# Patient Record
Sex: Female | Born: 2000 | Race: White | Hispanic: No | Marital: Single | Smoking: Never smoker
Health system: Southern US, Community
[De-identification: ages and names within clinical notes are randomized; demographics above are authoritative.]

---
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
## 2020-03-08 IMAGING — CT CT ANGIO CHEST
2 of 6 series · 18 of 46 positions shown · IV contrast (omnipaque)
Comparison: None.

CLINICAL DATA: Suspected pulmonary embolism

EXAM:
CT ANGIOGRAPHY CHEST WITH CONTRAST
TECHNIQUE: Multidetector CT imaging of the chest was performed using the
standard protocol during bolus administration of intravenous
contrast. Multiplanar CT image reconstructions and MIPs were
obtained to evaluate the vascular anatomy.
CONTRAST:  75mL OMNIPAQUE IOHEXOL 350 MG/ML SOLN

[Series 6: thins · axial · 0.62mm/px · z∈[+1364,+1595]mm · 15 of 255 slices shown]
[im 12/255  lung]
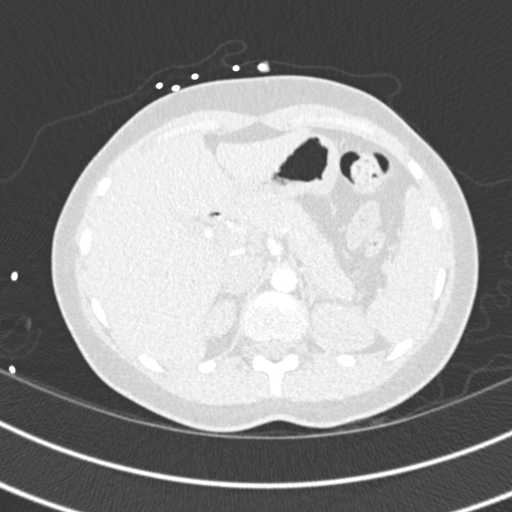
[im 34/255  soft-tissue]
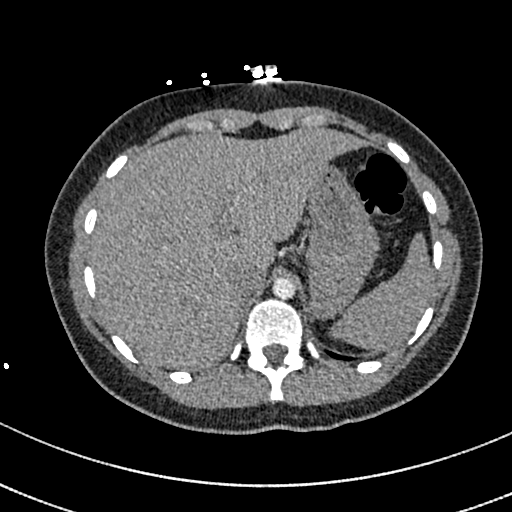
[im 45/255  lung]
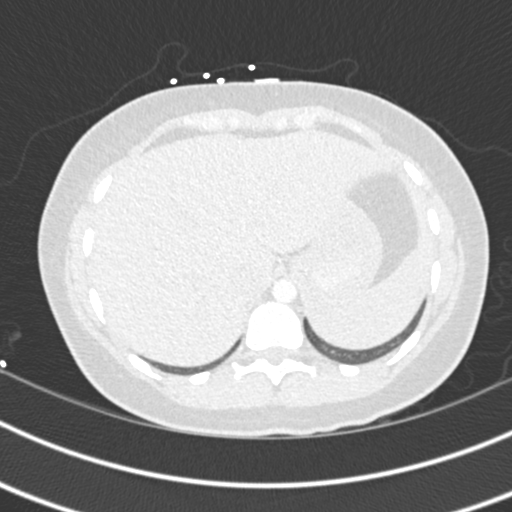
[im 67/255  soft-tissue]
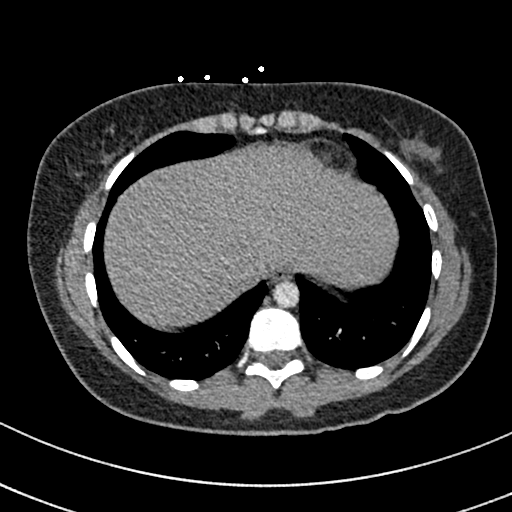
[im 78/255  lung]
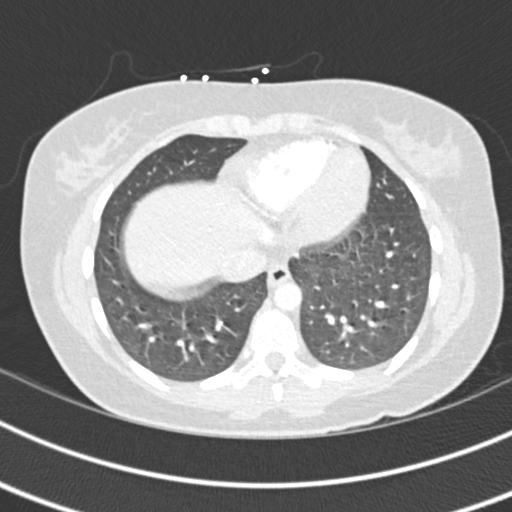
[im 100/255  soft-tissue]
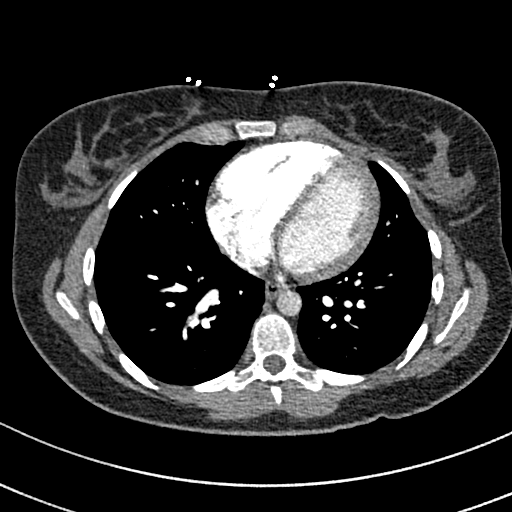
[im 111/255  lung]
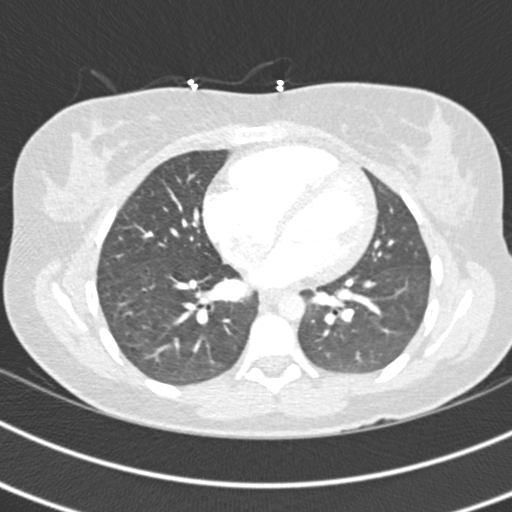
[im 133/255  soft-tissue]
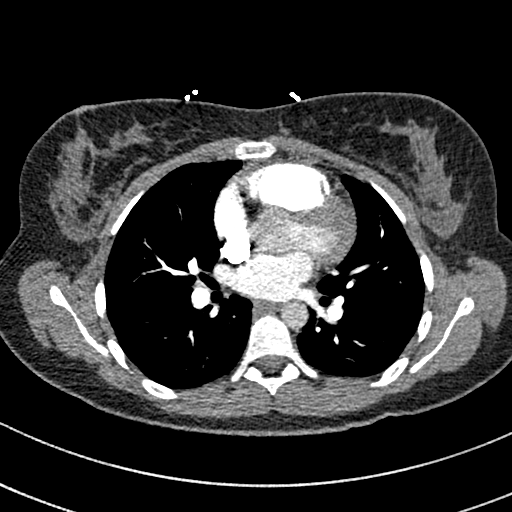
[im 144/255  lung]
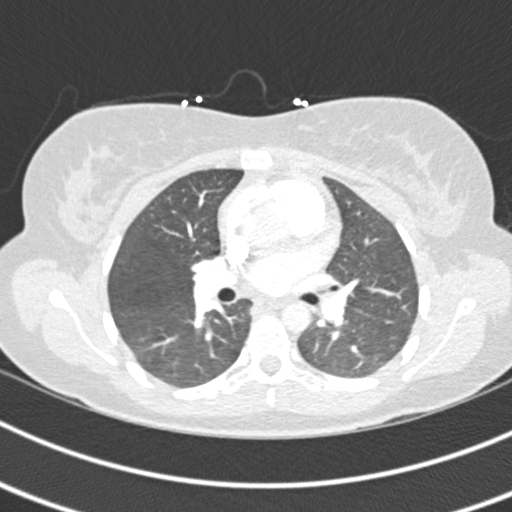
[im 155/255  soft-tissue]
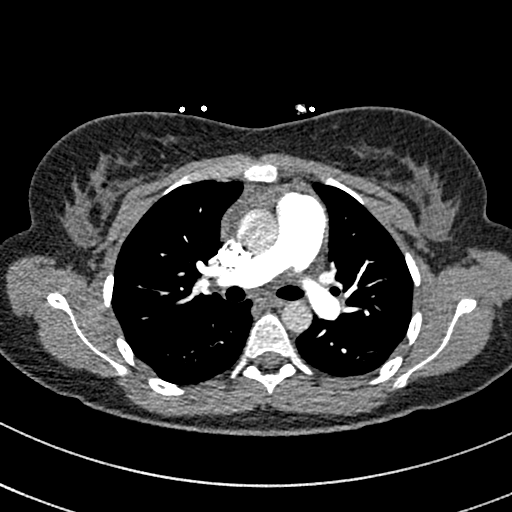
[im 177/255  lung]
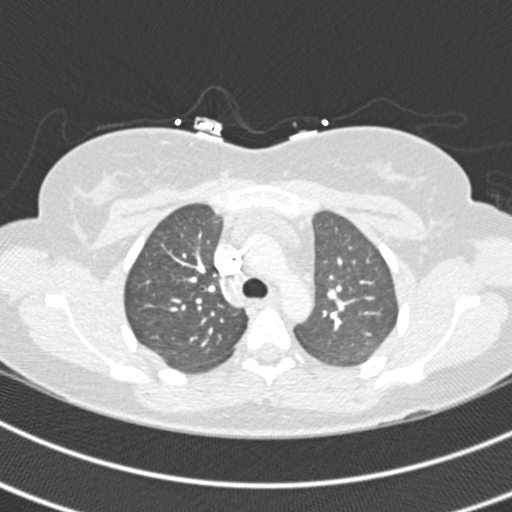
[im 188/255  soft-tissue]
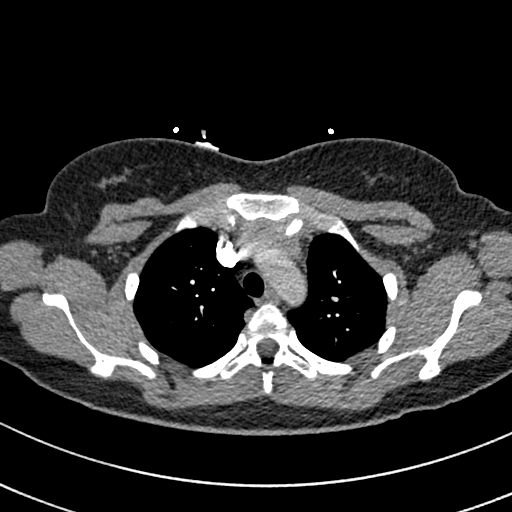
[im 210/255  lung]
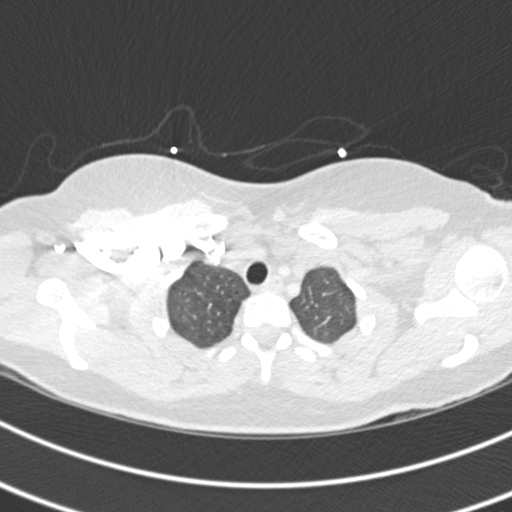
[im 221/255  soft-tissue]
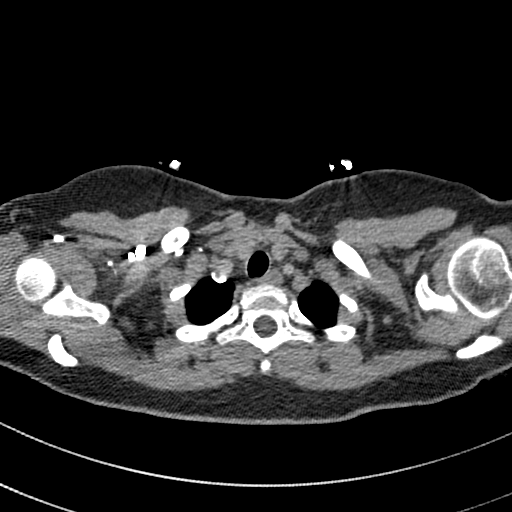
[im 243/255  lung]
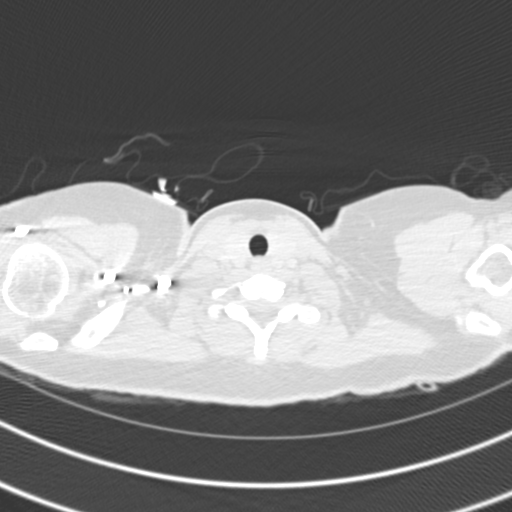

[Series 8: coronal mpr · coronal · 0.49mm/px · 3 of 120 slices shown]
[im 30/120  soft-tissue]
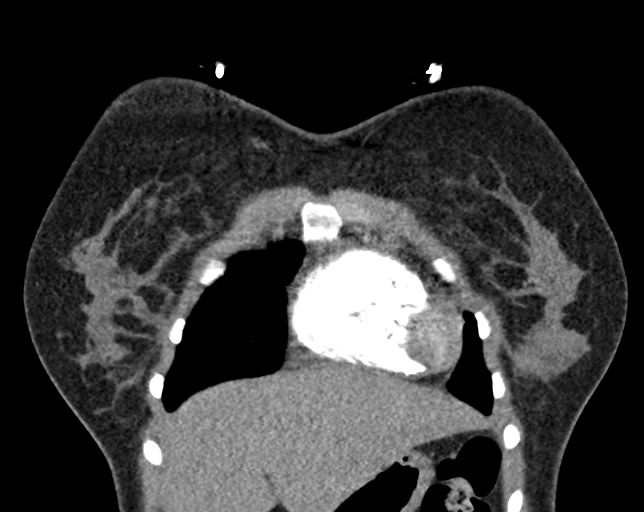
[im 60/120  soft-tissue]
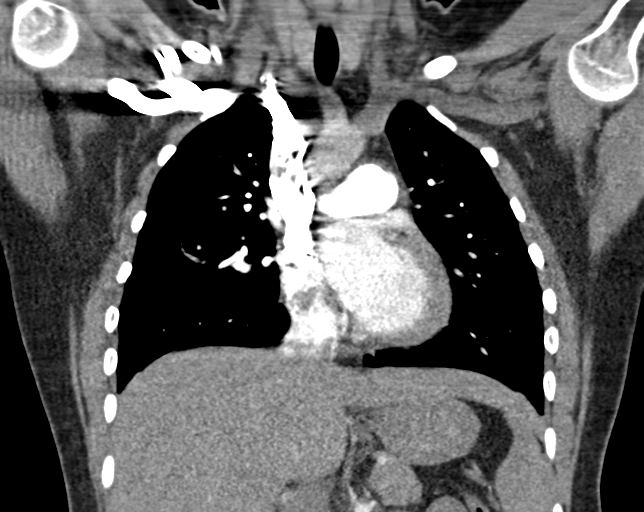
[im 90/120  soft-tissue]
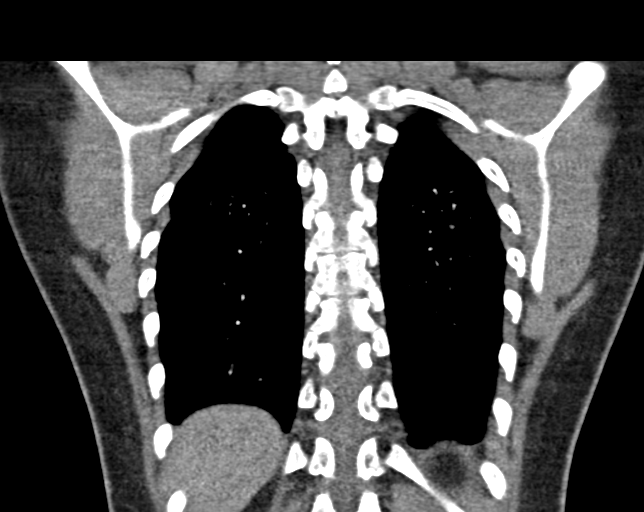

[18 of 46 positions shown; findings below may reference images not displayed]

FINDINGS: Cardiovascular: Heart size normal. No pericardial effusion.
Satisfactory opacification of pulmonary arteries noted, and there is
no evidence of pulmonary emboli. Fair contrast opacification of the
thoracic aorta with no evidence of dissection, aneurysm, or
stenosis. There is classic 3-vessel brachiocephalic arch anatomy
without proximal stenosis. No significant atheromatous change.
Visualized proximal abdominal aorta unremarkable.

Mediastinum/Nodes: No hilar or mediastinal adenopathy. Residual
thymic tissue in anterior mediastinum.

Lungs/Pleura: No pleural effusion. No pneumothorax. Lungs are clear.

Upper Abdomen: No acute findings.

Musculoskeletal: No chest wall abnormality. No acute or significant
osseous findings.

Review of the MIP images confirms the above findings.
IMPRESSION: Negative.  No acute PE or thoracic aortic dissection.
